# Patient Record
Sex: Female | Born: 1995 | Race: White | Hispanic: No | Marital: Single | Smoking: Never smoker
Health system: Southern US, Community
[De-identification: ages and names within clinical notes are randomized; demographics above are authoritative.]

## PROBLEM LIST (undated history)

## (undated) DIAGNOSIS — Z973 Presence of spectacles and contact lenses: Secondary | ICD-10-CM

## (undated) DIAGNOSIS — Z9229 Personal history of other drug therapy: Secondary | ICD-10-CM

## (undated) DIAGNOSIS — Q52129 Other and unspecified longitudinal vaginal septum: Secondary | ICD-10-CM

---
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
## 2017-02-22 IMAGING — US US ABDOMEN COMPLETE
1 series · 14 of 25 positions shown · non-contrast
Comparison: None.

CLINICAL DATA: Uterine congenital anomaly. Assess for associated
renal abnormalities.

EXAM:
ULTRASOUND ABDOMEN COMPLETE

[Series 1: us abdomen complete · 0.19mm/px · 14 of 71 slices shown]
[im 1/71]
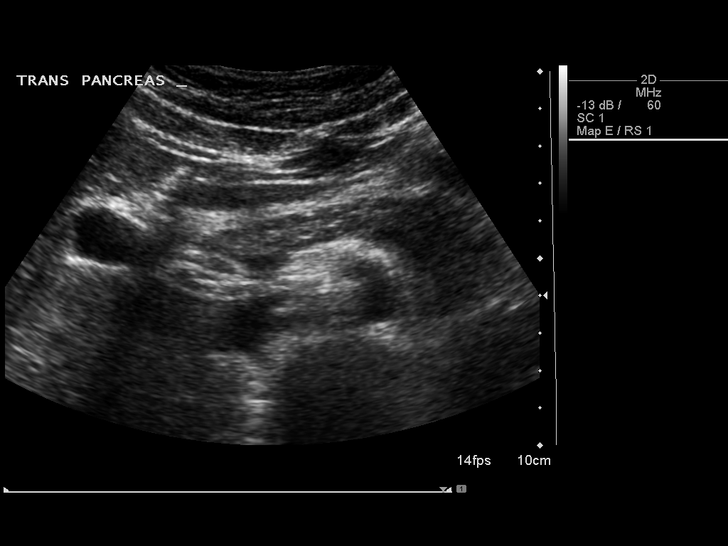
[im 6/71]
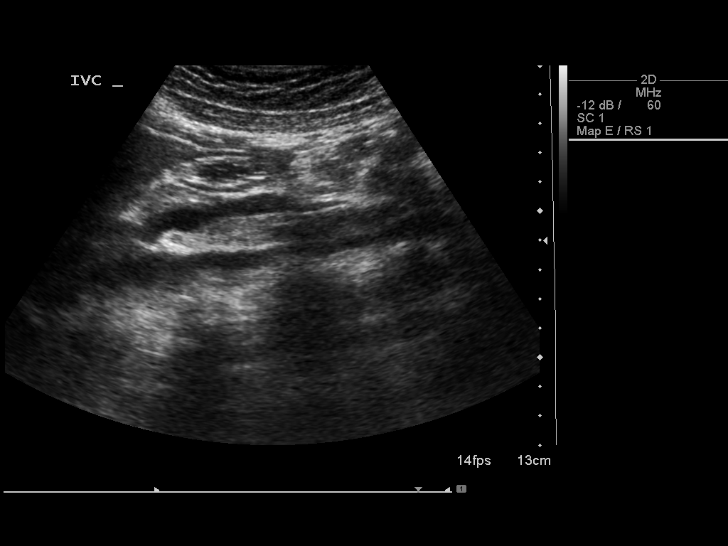
[im 12/71]
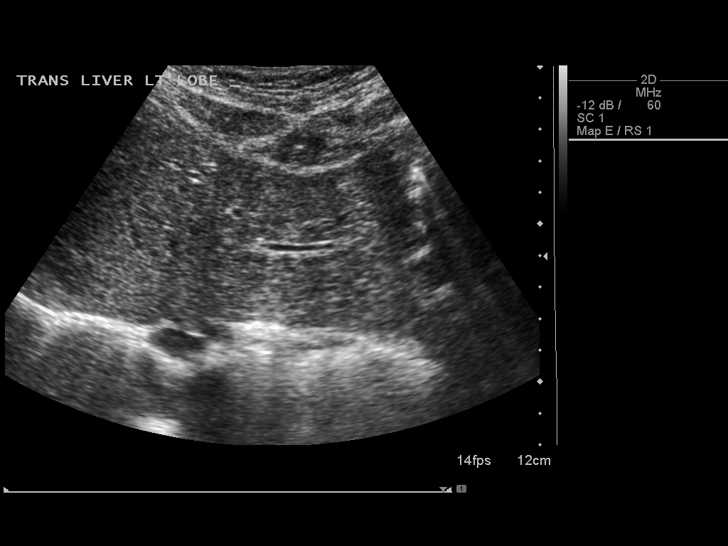
[im 18/71]
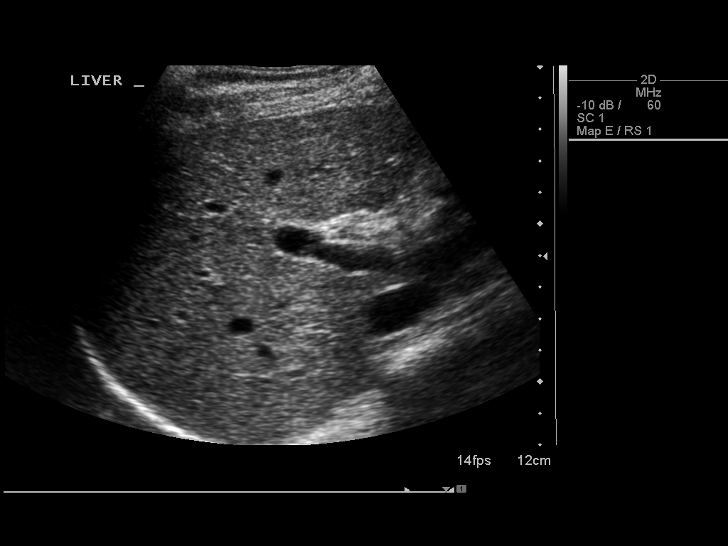
[im 24/71]
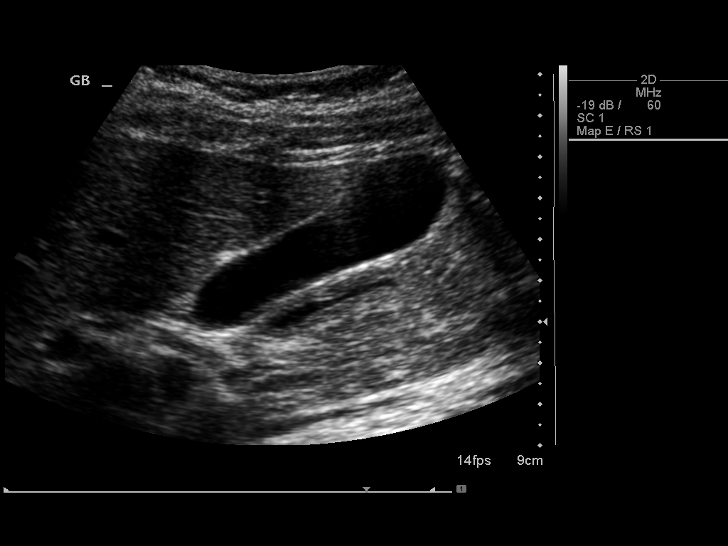
[im 27/71]
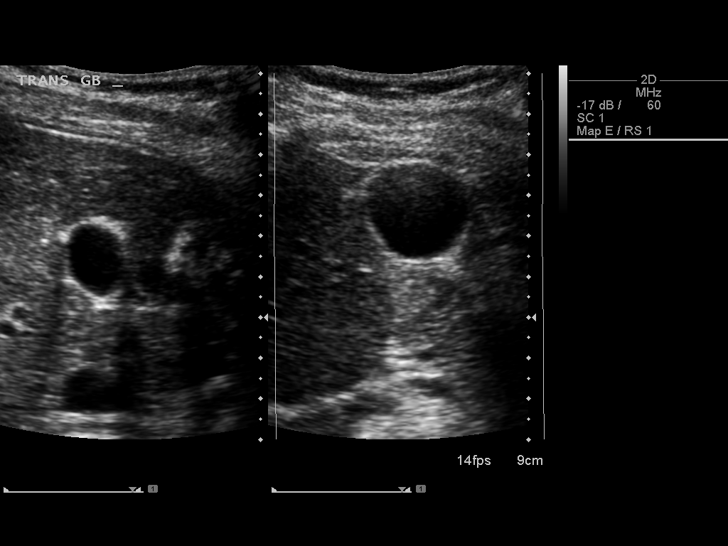
[im 33/71]
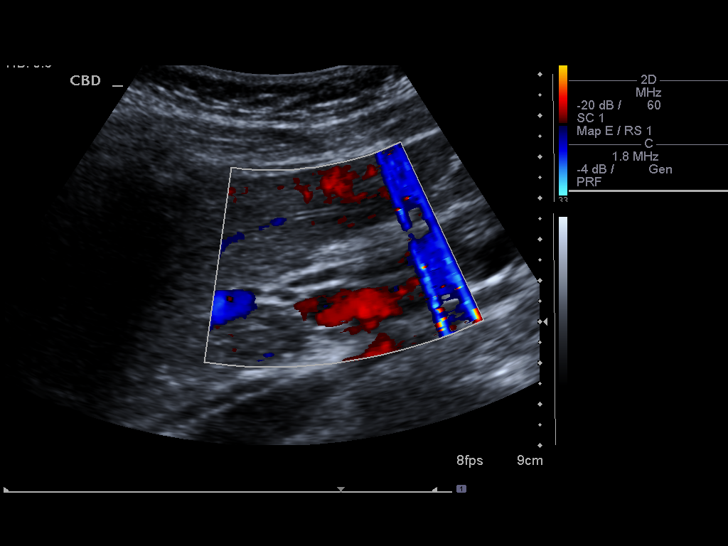
[im 38/71]
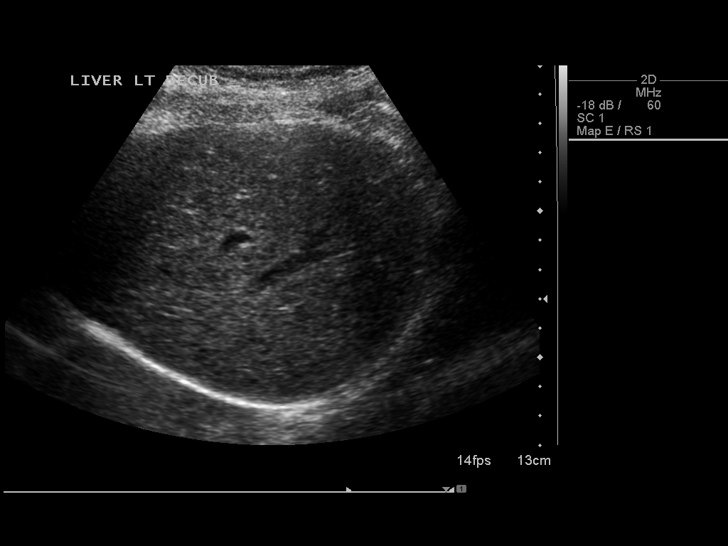
[im 44/71]
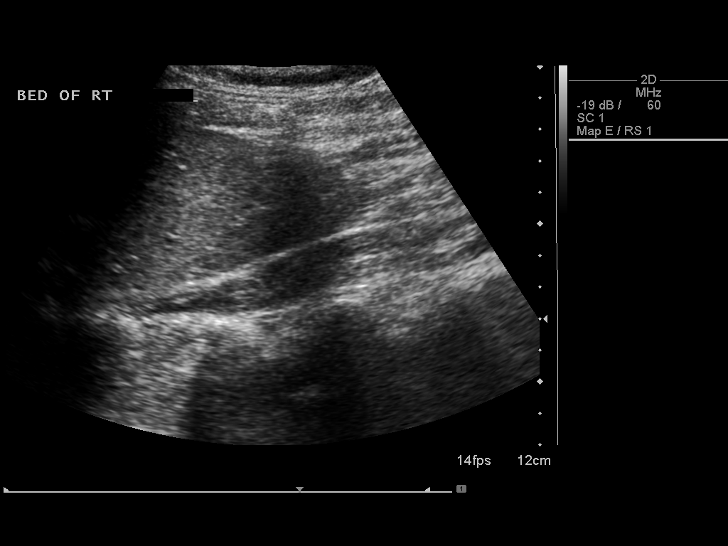
[im 47/71]
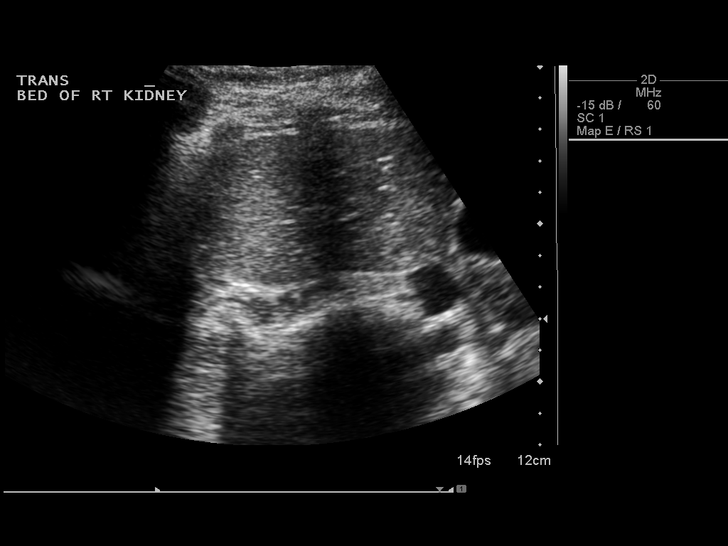
[im 53/71]
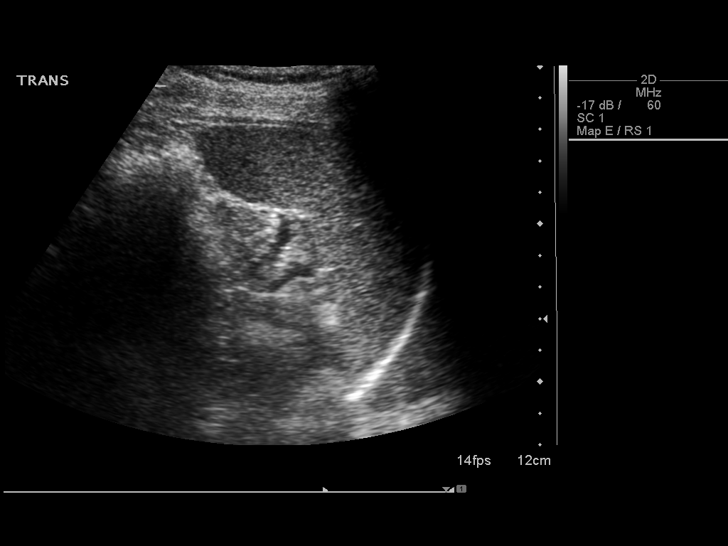
[im 59/71]
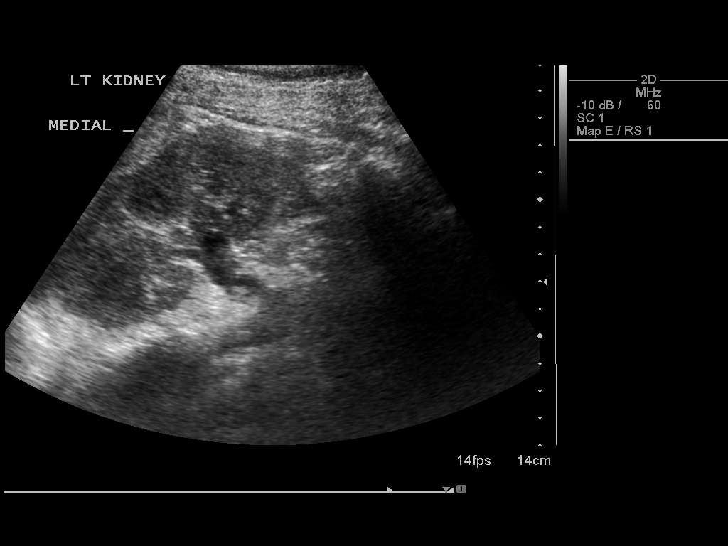
[im 65/71]
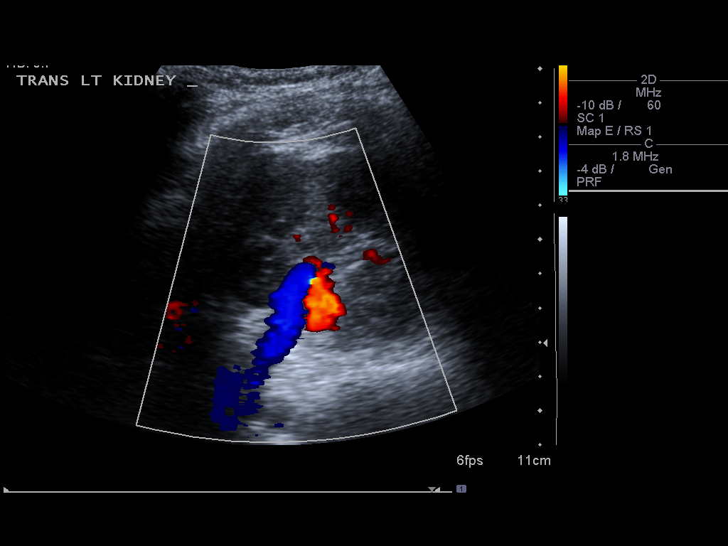
[im 71/71]
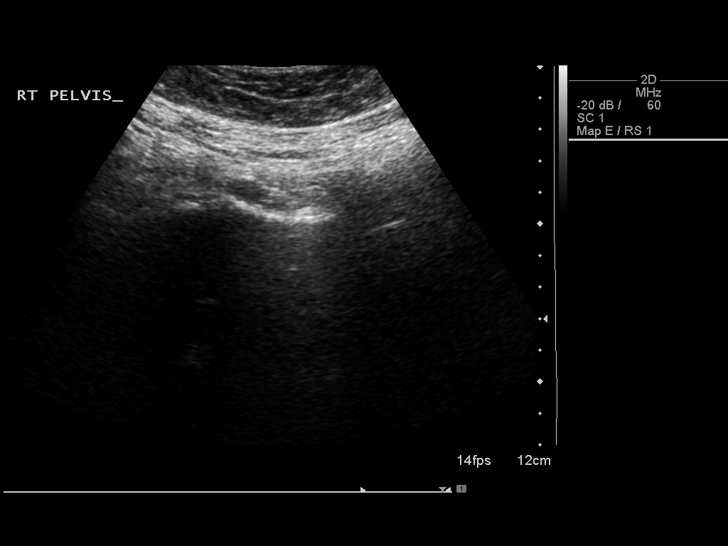

[14 of 25 positions shown; findings below may reference images not displayed]

FINDINGS: Gallbladder: No gallstones or wall thickening visualized. No
sonographic Murphy sign noted.

Common bile duct: Diameter: Normal 3 mm

Liver: No focal lesion identified. Within normal limits in
parenchymal echogenicity.

IVC: No abnormality visualized.

Pancreas: Visualized portion unremarkable.

Spleen: Size and appearance within normal limits.

Right Kidney: Length: Not identified sonographically.

Left Kidney: Length: 13.8 cm. Echogenicity within normal limits. No
mass or hydronephrosis visualized.

Abdominal aorta: No aneurysm visualized.

Other findings: No.
IMPRESSION: 1. The RIGHT kidney is not identified sonographically suggesting
agenesis.
2. The LEFT kidney is mildly enlarged.

## 2018-07-18 ENCOUNTER — Ambulatory Visit (INDEPENDENT_AMBULATORY_CARE_PROVIDER_SITE_OTHER): Payer: 59 | Admitting: Family Medicine

## 2018-07-18 ENCOUNTER — Other Ambulatory Visit: Payer: Self-pay

## 2018-07-18 ENCOUNTER — Encounter: Payer: Self-pay | Admitting: Family Medicine

## 2018-07-18 ENCOUNTER — Ambulatory Visit (INDEPENDENT_AMBULATORY_CARE_PROVIDER_SITE_OTHER): Payer: 59

## 2018-07-18 VITALS — BP 108/72 | HR 95 | Temp 98.1°F | Wt 139.4 lb

## 2018-07-18 DIAGNOSIS — S93401A Sprain of unspecified ligament of right ankle, initial encounter: Secondary | ICD-10-CM

## 2018-07-18 NOTE — Progress Notes (Signed)
   Subjective:    Patient ID: Cheryl Wall, female    DOB: Sep 27, 1995, 23 y.o.   MRN: [id number]  HPI Here for an injury to the right ankle that happened one week ago at home. While she was carrying something up some steps, she slipped and twisted the ankle. It has been swollen and painful since then. She has applied ice and used an ACE wrap. She has taken some Ibuprofen. 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    Review of Systems  Constitutional: Negative.   Respiratory: Negative.   Cardiovascular: Negative.   Musculoskeletal: Positive for arthralgias and joint swelling.       Objective:   Physical Exam Constitutional:      Comments: Walks with a slight limp   Cardiovascular:     Rate and Rhythm: Normal rate and regular rhythm.     Pulses: Normal pulses.     Heart sounds: Normal heart sounds.  Pulmonary:     Effort: Pulmonary effort is normal.     Breath sounds: Normal breath sounds.  Musculoskeletal:     Comments: The right ankle is swollen and mildly ecchymotic. She is tender over the lateral malleolus and just inferior to this. ROM is full. Xrays today reveal no fractures.   Neurological:     Mental Status: She is alert.           Assessment & Plan:  Ankle sprain. She is fitted with an elastic support sleeve. She can use Ibuprofen as needed. Recheck prn.  Alysia Penna, MD

## 2019-01-12 ENCOUNTER — Telehealth: Payer: Self-pay | Admitting: Family Medicine

## 2019-01-12 NOTE — Telephone Encounter (Signed)
Copied from CRM [id number]. Topic: General - Other >> Jan 12, 2019  3:51 PM Tamela Oddi wrote: Reason for CRM: Patient is calling to get a prescription that was originally supplied by another physician for birth control.  Please advise and call patient to discuss at [phone]

## 2019-01-12 NOTE — Telephone Encounter (Signed)
See note

## 2019-01-13 ENCOUNTER — Encounter: Payer: Self-pay | Admitting: Family Medicine

## 2019-01-13 ENCOUNTER — Other Ambulatory Visit: Payer: Self-pay

## 2019-01-13 ENCOUNTER — Telehealth (INDEPENDENT_AMBULATORY_CARE_PROVIDER_SITE_OTHER): Payer: 59 | Admitting: Family Medicine

## 2019-01-13 DIAGNOSIS — Z309 Encounter for contraceptive management, unspecified: Secondary | ICD-10-CM | POA: Diagnosis not present

## 2019-01-13 MED ORDER — DESOGESTREL-ETHINYL ESTRADIOL 0.15-0.02/0.01 MG (21/5) PO TABS: 1.0000 | ORAL_TABLET | Freq: Every day | ORAL | 3 refills | Status: DC

## 2019-01-13 NOTE — Telephone Encounter (Signed)
Spoke with the pt and she stated she wanted to know if Dr Selena Batten could refill the Rx for birth control that was given by Dr Providence Lanius (GYN).  I advised the pt she would need a visit as she has not been seen by Dr Selena Batten since 2016.  Virtual appt scheduled for today at 10:20am.

## 2019-01-13 NOTE — Progress Notes (Signed)
Virtual Visit via Telephone Note  I connected with Cheryl Wall on 01/13/19 at 12:00 PM EST by telephone and verified that I am speaking with the correct person using two identifiers.   I discussed the limitations, risks, security and privacy concerns of performing an evaluation and management service by telephone and the availability of in person appointments. I also discussed with the patient that there may be a patient responsible charge related to this service. The patient expressed understanding and agreed to proceed.  Location patient: home Location provider: work or home office Participants present for the call: patient, provider Patient did not have a visit in the prior 7 days to address this/these issue(s).   History of Present Illness:  Contraception management: -requesting OCP refill -Patient was taking Garnette Scheuermann in the past and felt that it worked well for her without any side effect -FDLMP: Dec 16th, 2020, periods have been regular and monthly -currently sexually active, using protection with condoms every time -no concern for STI or pregnancy currently -denies contraindications, HTN, migraine, stroke, blood clot, etc  Observations/Objective: Patient sounds cheerful and well on the phone. I do not appreciate any SOB. Speech and thought processing are grossly intact. Patient reported vitals:  Assessment and Plan:  Encounter for contraceptive management, unspecified type  -we discussed possible serious and likely etiologies, options for evaluation and workup, limitations of telemedicine visit vs in person visit, treatment, treatment risks and precautions. Pt prefers to treat via telemedicine empirically rather then risking or undertaking an in person visit at this moment. Discussed risks, etc of quickstart method without preg testing. She feels sure is not pregnant. Refill sent. Advised need inperson PCP and she agrees to schedule visit as will need of preventive visits.   Patient agrees to seek prompt in person care if worsening, new symptoms arise, or if is not improving with treatment.  Follow Up Instructions:  I did not refer this patient for an OV in the next 24 hours for this/these issue(s).  I discussed the assessment and treatment plan with the patient. The patient was provided an opportunity to ask questions and all were answered. The patient agreed with the plan and demonstrated an understanding of the instructions.   The patient was advised to call back or seek an in-person evaluation if the symptoms worsen or if the condition fails to improve as anticipated.  I provided 12 minutes of non-face-to-face time during this encounter.   Terressa Koyanagi, DO

## 2019-10-28 ENCOUNTER — Ambulatory Visit: Payer: 59 | Admitting: Family Medicine

## 2019-11-06 ENCOUNTER — Other Ambulatory Visit: Payer: Self-pay

## 2019-11-06 ENCOUNTER — Encounter: Payer: Self-pay | Admitting: Family Medicine

## 2019-11-06 ENCOUNTER — Ambulatory Visit (INDEPENDENT_AMBULATORY_CARE_PROVIDER_SITE_OTHER): Payer: 59 | Admitting: Family Medicine

## 2019-11-06 VITALS — BP 104/58 | HR 90 | Temp 98.9°F | Ht 67.0 in | Wt 129.8 lb

## 2019-11-06 DIAGNOSIS — L509 Urticaria, unspecified: Secondary | ICD-10-CM | POA: Diagnosis not present

## 2019-11-06 MED ORDER — PREDNISONE 10 MG PO TABS: ORAL_TABLET | ORAL | 0 refills | Status: AC

## 2019-11-06 MED ORDER — NEXPLANON 68 MG ~~LOC~~ IMPL: 1.0000 | DRUG_IMPLANT | Freq: Once | SUBCUTANEOUS | 0 refills | Status: AC

## 2019-11-06 NOTE — Progress Notes (Signed)
   Subjective:    Patient ID: Cheryl Wall, female    DOB: 1995/10/15, 24 y.o.   MRN: [id number]  HPI Here for 3 weeks of an itchy rash all over her body. This waxes and wanes. No other symptoms. No recent exposure to new detergents, soaps, etc. No SOB. She has been under stress because she moved to [place] 2 weeks ago. She is here visiting this week. She has taken Zurtec and applied benadryl cream with no effect.    Review of Systems  Constitutional: Negative.   Respiratory: Negative.   Cardiovascular: Negative.   Skin: Positive for rash.       Objective:   Physical Exam Constitutional:      General: She is not in acute distress.    Appearance: Normal appearance.  Cardiovascular:     Rate and Rhythm: Normal rate and regular rhythm.     Pulses: Normal pulses.     Heart sounds: Normal heart sounds.  Pulmonary:     Effort: Pulmonary effort is normal.     Breath sounds: Normal breath sounds.  Skin:    Comments: Scattered patches of macular or papular erythema   Neurological:     Mental Status: She is alert.           Assessment & Plan:  Hives. We will treat with a 15 day taper of Prednisone, beginning with 50 mg daily. Add Benadryl as needed.  Gershon Crane, MD

## 2021-02-04 IMAGING — DX RIGHT ANKLE - COMPLETE 3+ VIEW
3 series · 3 of 3 positions shown · non-contrast
Comparison: No recent.

CLINICAL DATA: Right ankle injury.

EXAM:
RIGHT ANKLE - COMPLETE 3+ VIEW

[ankle ap]
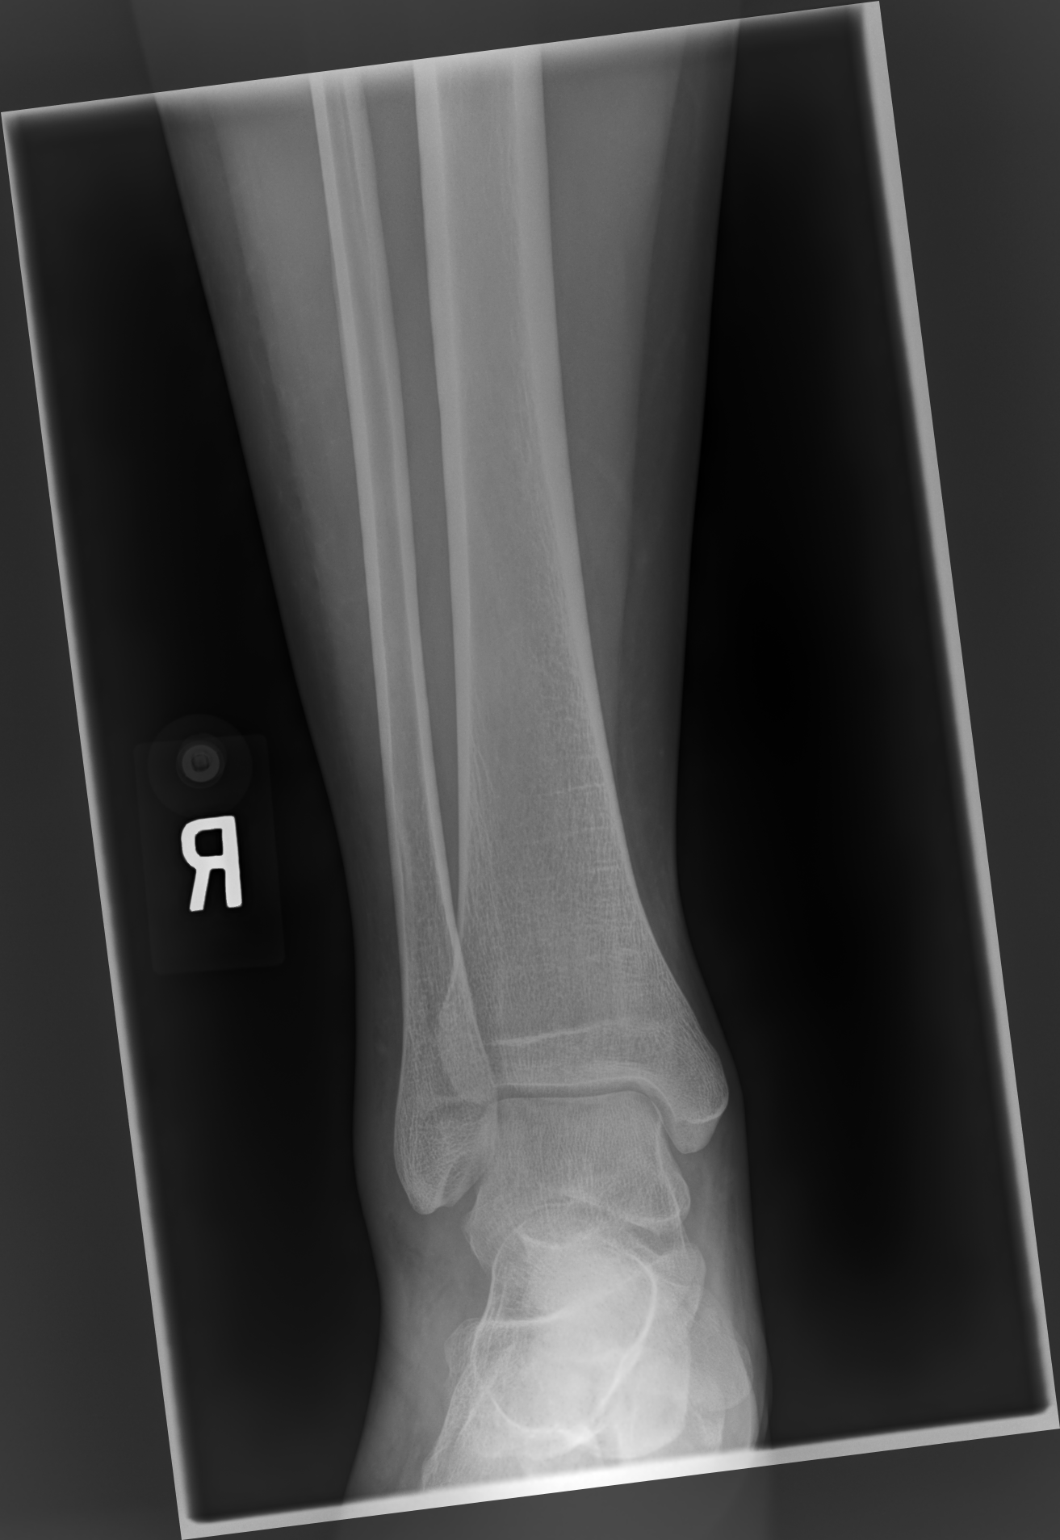

[ankle mlo]
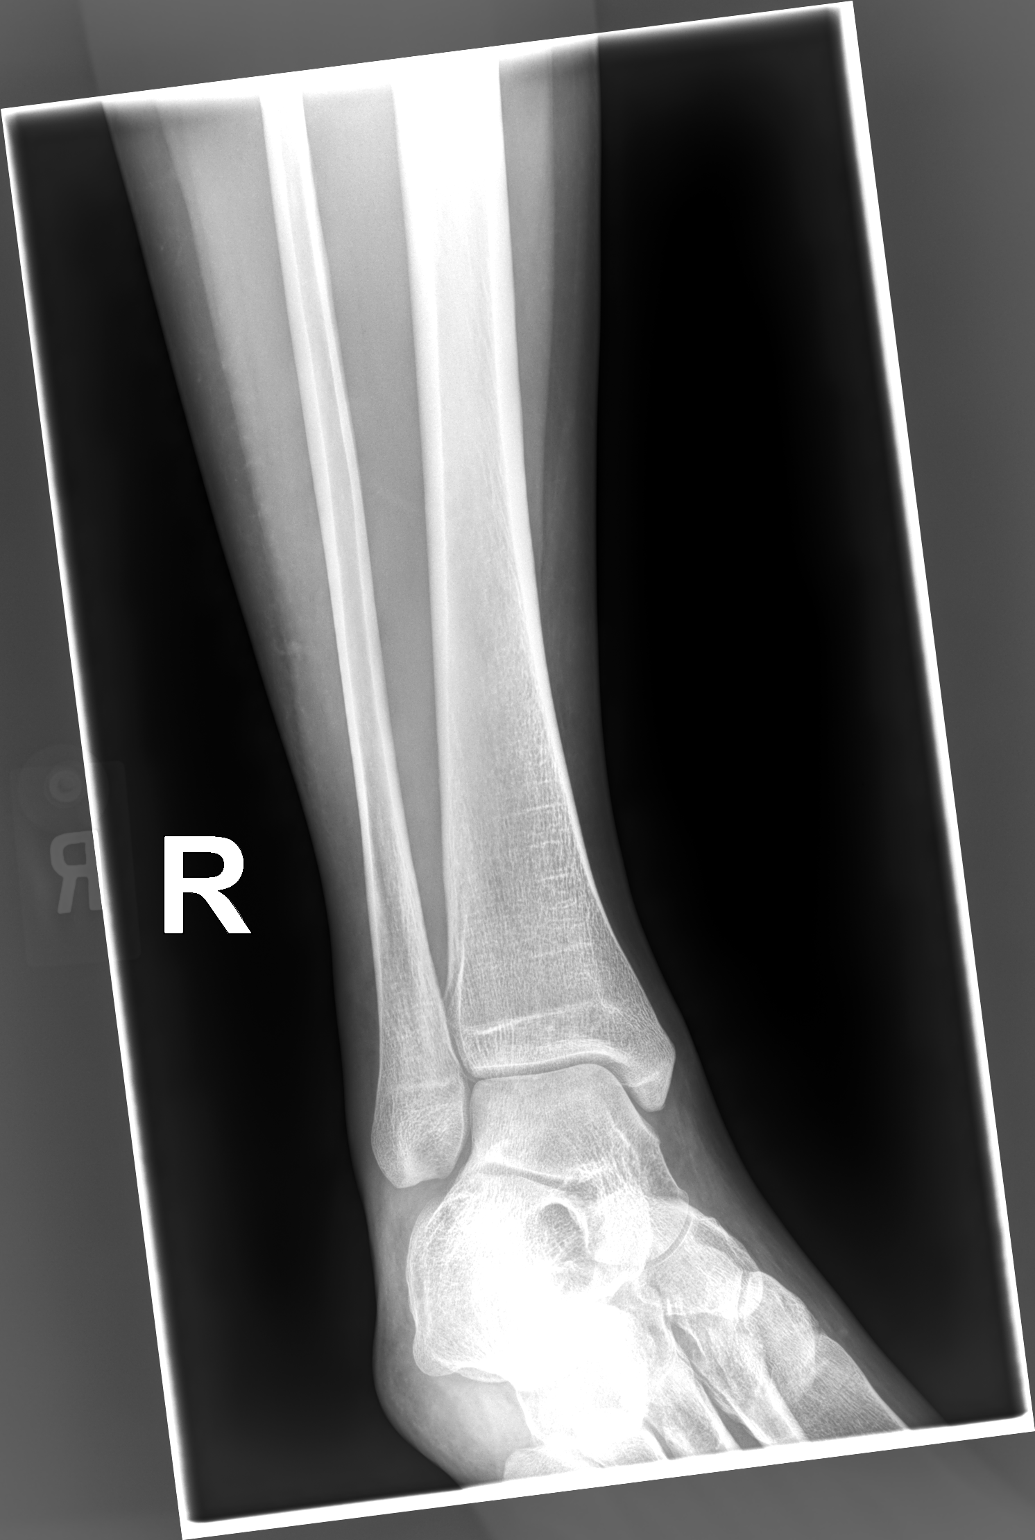

[ankle lat]
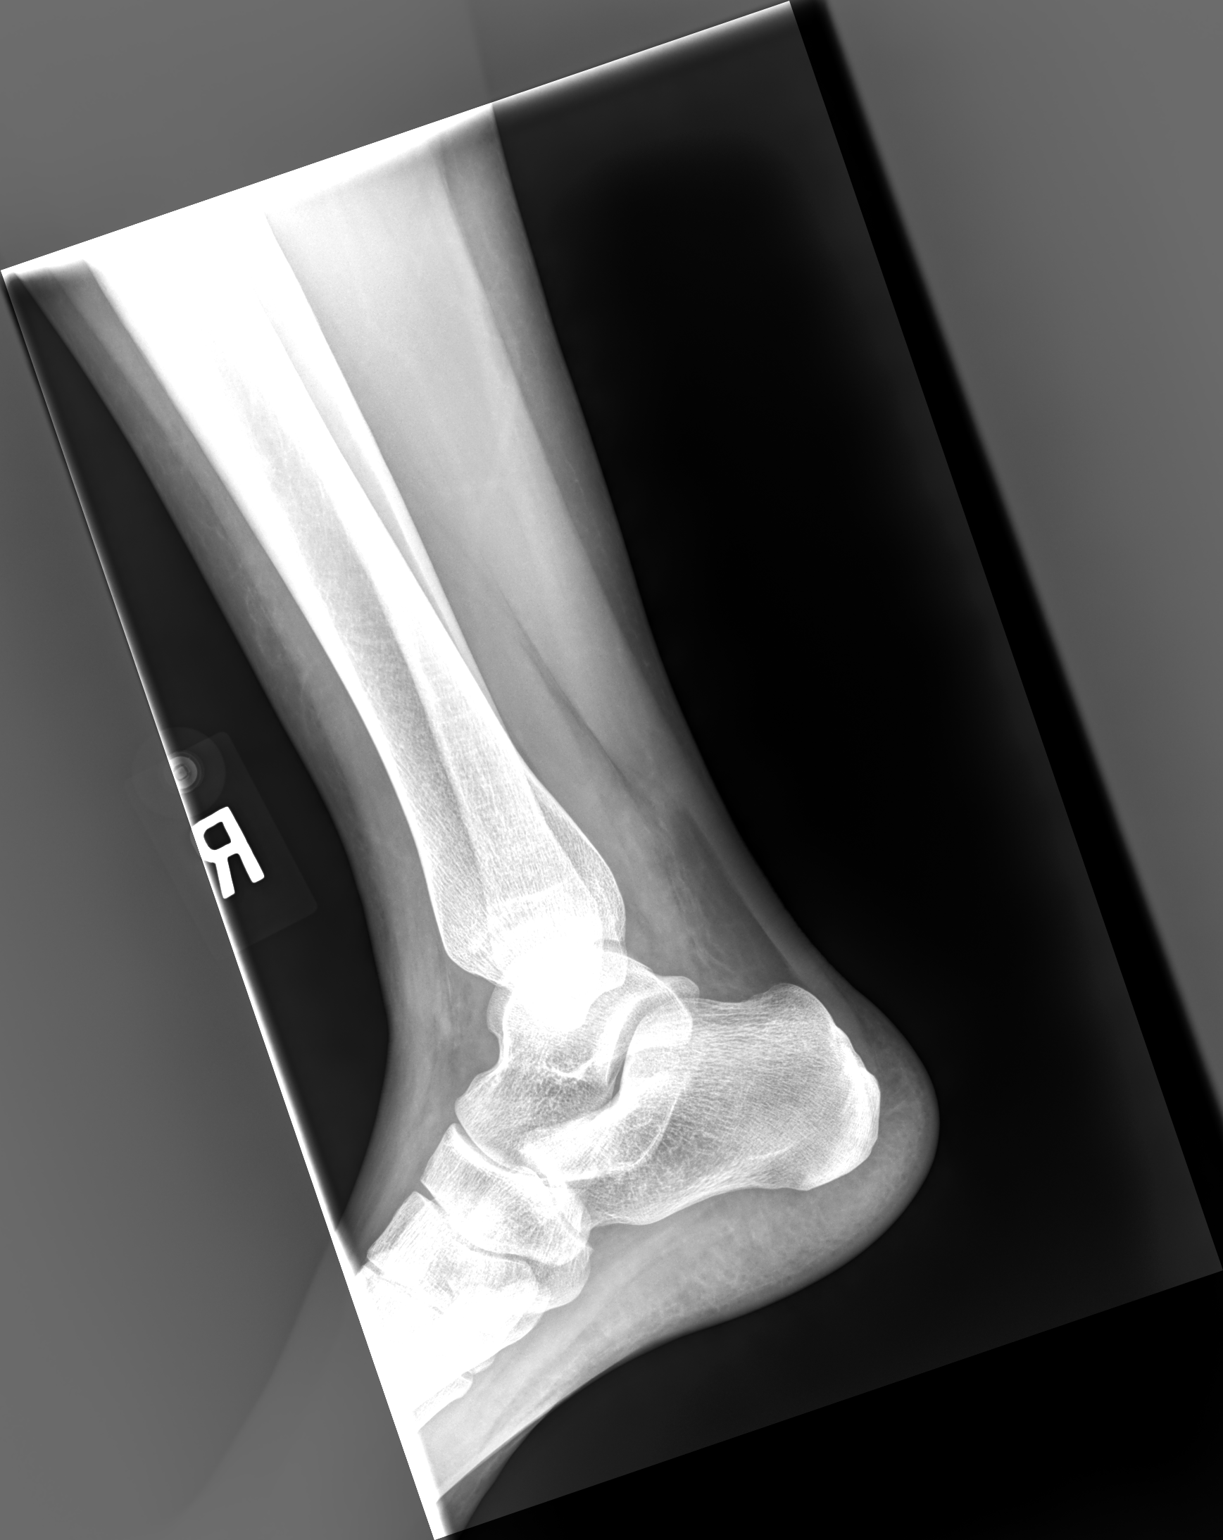

[3 of 3 positions shown; findings below may reference images not displayed]

FINDINGS: Diffuse soft tissue swelling. No evidence of fracture or
dislocation.
IMPRESSION: No acute bony abnormality.
